# Patient Record
Sex: Male | Born: 1995 | Race: Black or African American | Hispanic: No | Marital: Single | Smoking: Never smoker
Health system: Southern US, Community
[De-identification: ages and names within clinical notes are randomized; demographics above are authoritative.]

## PROBLEM LIST (undated history)

## (undated) HISTORY — PX: FRACTURE SURGERY: SHX138

---
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
## 2020-02-20 ENCOUNTER — Other Ambulatory Visit: Payer: Self-pay

## 2020-02-20 ENCOUNTER — Encounter (HOSPITAL_COMMUNITY): Payer: Self-pay

## 2020-02-20 ENCOUNTER — Ambulatory Visit (HOSPITAL_COMMUNITY)
Admission: EM | Admit: 2020-02-20 | Discharge: 2020-02-20 | Disposition: A | Discharge status: Home / Self Care | Payer: Self-pay | Attending: Family | Admitting: Family

## 2020-02-20 DIAGNOSIS — F322 Major depressive disorder, single episode, severe without psychotic features: Secondary | ICD-10-CM | POA: Insufficient documentation
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
## 2020-02-20 NOTE — ED Provider Notes (Signed)
Behavioral Health Urgent Care Medical Screening Exam  Patient Name: Eduardo Jefferson MRN: [id number] Date of Evaluation: 02/20/20 Chief Complaint:   Diagnosis:  Final diagnoses:  Current severe episode of major depressive disorder without psychotic features without prior episode (HCC)    History of Present illness: Eduardo Jefferson is a 24 y.o. male.  Patient presents voluntarily to [facility].  Patient reports he came today because he is seeking outpatient therapy as well as a discussion surrounding medication.  Patient reports he is about to be a father and does not want his mental health issues to affect his parenting.  Patient also reports his girlfriend follows up with outpatient psychiatry as well as counseling and he believes this as beneficial.  Patient denies any history of previous outpatient psychiatric treatment, patient denies any mental health diagnoses.  Patient reports he feels that he has a depressed mood "for a while."  Patient denies suicidal ideations currently.  Patient denies self-harm behaviors.  Patient endorses 2 prior suicide attempts, last attempt 4 months ago after an argument with his girlfriend.  Patient contracts verbally for safety with this Clinical research associate.  Denies auditory and visual hallucinations currently.  Patient reports 1 previous instance of feeling that he heard "somebody calling my name."  Patient believes this may have been related to his depression.  Patient resides in [place] with his grandmother.  Patient reports he would prefer not to stay at his grandmother's house because he does not have a room to himself there.  Patient denies access to weapons.  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  Patient denies alcohol use.  Patient endorses daily marijuana use.  Patient denies substance use aside from marijuana.  Patient assessed by nurse practitioner.  Patient alert and oriented, answers  appropriately.  Patient denies homicidal ideations.  There is no evidence of delusional thought content and no indication that the patient is responding to internal stimuli.  Patient denies symptoms of paranoia.  Patient endorses average sleep and appetite.  Patient offered support and encouragement.  Patient gives verbal consent to speak with his girlfriend, Dewaine Conger phone number [phone].  With patient's girlfriend in lobby who denies current for patient safety.  Patient's girlfriend denies knowledge that patient has access to any weapons.  Patient's girlfriend reports plan to assist patient with facilitating outpatient follow-up.   Psychiatric Specialty Exam  Presentation  General Appearance:Appropriate for Environment; Casual  Eye Contact:Good  Speech:Clear and Coherent; Normal Rate  Speech Volume:Normal  Handedness:Right   Mood and Affect  Mood:Depressed  Affect:Appropriate; Congruent   Thought Process  Thought Processes:Coherent; Goal Directed  Descriptions of Associations:Intact  Orientation:Full (Time, Place and Person)  Thought Content:WDL; Logical  Hallucinations:None  Ideas of Reference:None  Suicidal Thoughts:No  Homicidal Thoughts:No   Sensorium  Memory:Immediate Good; Recent Good; Remote Good  Judgment:Fair  Insight:Good   Executive Functions  Concentration:Good  Attention Span:Good  Recall:Good  Fund of Knowledge:Good  Language:Good   Psychomotor Activity  Psychomotor Activity:Normal   Assets  Assets:Communication Skills; Desire for Improvement; Financial Resources/Insurance; Intimacy; Leisure Time; Physical Health; Resilience; Social Support; Talents/Skills   Sleep  Sleep:Good  Number of hours: No data recorded  Physical Exam: Physical Exam Vitals and nursing note reviewed.  Constitutional:      Appearance: He is well-developed.  HENT:     Head: Normocephalic.  Cardiovascular:     Rate and Rhythm: Normal  rate.  Pulmonary:     Effort: Pulmonary  effort is normal.  Neurological:     Mental Status: He is alert and oriented to person, place, and time.  Psychiatric:        Attention and Perception: Attention and perception normal.        Mood and Affect: Affect normal. Mood is depressed.        Speech: Speech normal.        Behavior: Behavior normal. Behavior is cooperative.        Thought Content: Thought content normal.        Cognition and Memory: Cognition and memory normal.        Judgment: Judgment normal.    Review of Systems  Constitutional: Negative.   HENT: Negative.   Eyes: Negative.   Respiratory: Negative.   Cardiovascular: Negative.   Gastrointestinal: Negative.   Genitourinary: Negative.   Musculoskeletal: Negative.   Skin: Negative.   Neurological: Negative.   Endo/Heme/Allergies: Negative.   Psychiatric/Behavioral: Positive for depression and substance abuse.   Blood pressure 127/83, pulse 97, temperature 98.7 F (37.1 C), temperature source Tympanic, resp. rate 18, SpO2 100 %. There is no height or weight on file to calculate BMI.  Musculoskeletal: Strength & Muscle Tone: within normal limits Gait & Station: normal Patient leans: N/A   [facility] MSE Discharge Disposition for Follow up and Recommendations: Patient reviewed with Dr. Nelly Rout. Based on my evaluation the patient does not appear to have an emergency medical condition and can be discharged with resources and follow up care in outpatient services for Medication Management and Individual Therapy   Patrcia Dolly, FNP 02/20/2020, 6:18 PM

## 2020-02-20 NOTE — ED Notes (Signed)
Patient A&O x 4, ambulatory. Patient discharged in no acute distress. Patient denied SI/HI, A/VH upon discharge. Patient verbalized understanding of all discharge instructions explained by staff, to include follow up appointments and safety plan. Patient escorted to lobby via staff for transport to destination. Safety maintained.  

## 2020-02-20 NOTE — Discharge Instructions (Signed)

## 2020-02-20 NOTE — ED Triage Notes (Signed)
Pt presents with SI/HI no plans. Pt states, "The thoughts come and go but not today. I just wanted to see if I could talk with a therapist and possibly be put on some medication"

## 2020-04-23 ENCOUNTER — Ambulatory Visit: Payer: Medicaid Other | Admitting: Physician Assistant

## 2020-04-23 ENCOUNTER — Other Ambulatory Visit: Payer: Self-pay

## 2020-04-23 DIAGNOSIS — Z113 Encounter for screening for infections with a predominantly sexual mode of transmission: Secondary | ICD-10-CM

## 2020-04-23 DIAGNOSIS — A549 Gonococcal infection, unspecified: Secondary | ICD-10-CM

## 2020-04-23 DIAGNOSIS — A5401 Gonococcal cystitis and urethritis, unspecified: Secondary | ICD-10-CM

## 2020-04-23 DIAGNOSIS — Z202 Contact with and (suspected) exposure to infections with a predominantly sexual mode of transmission: Secondary | ICD-10-CM

## 2020-04-23 MED ORDER — DOXYCYCLINE HYCLATE 100 MG PO TABS: 100.0000 mg | ORAL_TABLET | Freq: Two times a day (BID) | ORAL | 0 refills | Status: AC

## 2020-04-23 MED ORDER — CEFTRIAXONE SODIUM 500 MG IJ SOLR
500.0000 mg | Freq: Once | INTRAMUSCULAR | Status: AC
  Administered 2020-04-23: 500 mg via INTRAMUSCULAR

## 2020-04-23 NOTE — Progress Notes (Signed)
Gram stain reviewed, positive GC, pt treated per provider orders. Provider orders completed.

## 2020-04-24 LAB — GRAM STAIN

## 2020-04-25 ENCOUNTER — Encounter: Payer: Self-pay | Admitting: Physician Assistant

## 2020-04-25 NOTE — Progress Notes (Signed)
[facility] Department STI clinic/screening visit  Subjective:  Eduardo Jefferson is a 25 y.o. male being seen today for an STI screening visit. The patient reports they do have symptoms.    Patient has the following medical conditions:   Patient Active Problem List   Diagnosis Date Noted  . Current severe episode of major depressive disorder without psychotic features without prior episode [facility])      Chief Complaint  Patient presents with  . SEXUALLY TRANSMITTED DISEASE    screening    HPI  Patient reports that he is having symptoms and is a contact to GC and Chlamydia.  Reports that he has had white discharge and dysuria for 2 weeks.  Denies other symptoms, chronic conditions and regular medicines.  Reports that he has not had a HIV test previously and last void prior to sample collection for Gram stain was over 2 hr ago.   See flowsheet for further details and programmatic requirements.    The following portions of the patient's history were reviewed and updated as appropriate: allergies, current medications, past medical history, past social history, past surgical history and problem list.  Objective:  There were no vitals filed for this visit.  Physical Exam Constitutional:      General: He is not in acute distress.    Appearance: Normal appearance.  HENT:     Head: Normocephalic and atraumatic.     Comments: No nits,lice, or hair loss. No cervical, supraclavicular or axillary adenopathy.    Mouth/Throat:     Mouth: Mucous membranes are moist.     Pharynx: Oropharynx is clear. No oropharyngeal exudate or posterior oropharyngeal erythema.  Eyes:     Conjunctiva/sclera: Conjunctivae normal.  Pulmonary:     Effort: Pulmonary effort is normal.  Abdominal:     Palpations: Abdomen is soft. There is no mass.     Tenderness: There is no abdominal tenderness. There is no guarding or rebound.  Genitourinary:    Penis: Normal.      Testes: Normal.      Comments: Pubic area without nits, lice, hair loss, edema, erythema, lesions and inguinal adenopathy. Penis circumcised without rash or lesions. Small amount of cloudy discharge at meatus. Musculoskeletal:     Cervical back: Neck supple. No tenderness.  Skin:    General: Skin is warm and dry.     Findings: No bruising, erythema, lesion or rash.  Neurological:     Mental Status: He is alert and oriented to person, place, and time.  Psychiatric:        Mood and Affect: Mood normal.        Behavior: Behavior normal.        Thought Content: Thought content normal.        Judgment: Judgment normal.       Assessment and Plan:  Eduardo Jefferson is a 25 y.o. male presenting to the [facility] Department for STI screening   1. Screening for STD (sexually transmitted disease) Patient into clinic with symptoms. Rec condoms with all sex. Await test results.  Counseled that RN will call if needs to RTC for treatment once results are back. - Gram stain - HIV [facility] - Syphilis Serology, [facility]   2. Gonococcal urethritis in male Will treat for GC and cover for Chlamydia with Ceftriaxone 500 mg IM and Doxycycline 100 mg #14 1 po BID for 7 days. No sex for 14 days and until after partner completes  treatment. Call with questions or concerns. - cefTRIAXone (ROCEPHIN) injection 500 mg - doxycycline (VIBRA-TABS) 100 MG tablet; Take 1 tablet (100 mg total) by mouth 2 (two) times daily for 7 days.  Dispense: 14 tablet; Refill: 0     Return in about 3 months (around 07/21/2020) for TOC.  No future appointments.  Matt Holmes, PA

## 2022-07-04 ENCOUNTER — Ambulatory Visit: Payer: Self-pay | Admitting: *Deleted

## 2022-07-04 NOTE — Telephone Encounter (Signed)
  Chief Complaint: Depression and anxiety Symptoms: Depressed and anxious, "Can't see my kids." States drinks alcohol "But not as much as I used to" Smokes pot. States he has had thoughts of hurting himself and others. States "I just need help."  Frequency: "As long as I can remember."  Pertinent Negatives: Patient denies  Disposition: ED /[] Urgent Care (no appt availability in office) / Appointment(In office/virtual)/  [facility]/ Home Care/ Refused Recommended Disposition /[] [facility]/  Follow-up with PCP Additional Notes: Advised ED. Also provided number to [facility] and 988 hotline. No PCP. States he will go to ED. Reason for Disposition  [1] SEVERE anxiety (e.g., extremely anxious with intense emotional symptoms such as feeling of unreality, urge to flee, unable to calm down; unable to cope or function) AND [2] not better after 10 minutes of reassurance and Care Advice  Answer Assessment - Initial Assessment Questions 1. CONCERN: "Did anything happen that prompted you to call today?"      "Don't get to see my kids."  2. ANXIETY SYMPTOMS: "Can you describe how you (your loved one; patient) have been feeling?" (e.g., tense, restless, panicky, anxious, keyed up, overwhelmed, sense of impending doom).      Overwhelmed 3. ONSET: "How long have you been feeling this way?" (e.g., hours, days, weeks)     "As long as I can remember." 4. SEVERITY: "How would you rate the level of anxiety?" (e.g., 0 - 10; or mild, moderate, severe).      5. FUNCTIONAL IMPAIRMENT: "How have these feelings affected your ability to do daily activities?" "Have you had more difficulty than usual doing your normal daily activities?" (e.g., getting better, same, worse; self-care, school, work, interactions)     Yes, all 6. HISTORY: "Have you felt this way before?" "Have you ever been diagnosed with an anxiety problem in the past?" (e.g., generalized anxiety disorder, panic attacks, PTSD).  If Yes, ask: "How was this problem treated?" (e.g., medicines, counseling, etc.)      7. RISK OF HARM - SUICIDAL IDEATION: "Do you ever have thoughts of hurting or killing yourself?" If Yes, ask:  "Do you have these feelings now?" "Do you have a plan on how you would do this?"     Yes, me and others. 8. TREATMENT:  "What has been done so far to treat this anxiety?" (e.g., medicines, relaxation strategies). "What has helped?"      9. TREATMENT - THERAPIST: "Do you have a counselor or therapist? Name?"     No 10. POTENTIAL TRIGGERS: "Do you drink caffeinated beverages (e.g., coffee, colas, teas), and how much daily?" "Do you drink alcohol or use any drugs?" "Have you started any new medicines recently?"       Drinking a lot  not so much now. Smokes pot 11. PATIENT SUPPORT: "Who is with you now?" "Who do you live with?" "Do you have family or friends who you can talk to?"        Nope 12. OTHER SYMPTOMS: "Do you have any other symptoms?" (e.g., feeling depressed, trouble concentrating, trouble sleeping, trouble breathing, palpitations or fast heartbeat, chest pain, sweating, nausea, or diarrhea)       Depression,all of above, work suffering  Protocols used: Anxiety and Panic Attack-A-AH

## 2023-02-20 ENCOUNTER — Emergency Department
Admission: EM | Admit: 2023-02-20 | Discharge: 2023-02-20 | Disposition: A | Discharge status: Home / Self Care | Payer: Medicaid Other | Attending: Emergency Medicine | Admitting: Emergency Medicine

## 2023-02-20 ENCOUNTER — Other Ambulatory Visit: Payer: Self-pay

## 2023-02-20 DIAGNOSIS — A749 Chlamydial infection, unspecified: Secondary | ICD-10-CM | POA: Diagnosis not present

## 2023-02-20 DIAGNOSIS — R300 Dysuria: Secondary | ICD-10-CM | POA: Diagnosis present

## 2023-02-20 LAB — URINALYSIS, COMPLETE (UACMP) WITH MICROSCOPIC
Bilirubin Urine: NEGATIVE
Glucose, UA: NEGATIVE mg/dL
Hgb urine dipstick: NEGATIVE
Ketones, ur: NEGATIVE mg/dL
Leukocytes,Ua: NEGATIVE
Nitrite: NEGATIVE
Protein, ur: NEGATIVE mg/dL
Specific Gravity, Urine: 1.014 (ref 1.005–1.030)
pH: 5 (ref 5.0–8.0)

## 2023-02-20 LAB — CHLAMYDIA/NGC RT PCR (ARMC ONLY)
Chlamydia Tr: DETECTED — AB
N gonorrhoeae: NOT DETECTED

## 2023-02-20 MED ORDER — CEFTRIAXONE SODIUM 1 G IJ SOLR
500.0000 mg | Freq: Once | INTRAMUSCULAR | Status: AC
  Administered 2023-02-20: 500 mg via INTRAMUSCULAR
  Filled 2023-02-20: qty 10

## 2023-02-20 MED ORDER — LIDOCAINE HCL (PF) 1 % IJ SOLN
5.0000 mL | Freq: Once | INTRAMUSCULAR | Status: AC
  Administered 2023-02-20: 5 mL
  Filled 2023-02-20: qty 5

## 2023-02-20 MED ORDER — AZITHROMYCIN 500 MG PO TABS
1000.0000 mg | ORAL_TABLET | Freq: Once | ORAL | Status: AC
  Administered 2023-02-20: 1000 mg via ORAL
  Filled 2023-02-20: qty 2

## 2023-02-20 NOTE — ED Provider Triage Note (Signed)
Emergency Medicine Provider Triage Evaluation Note  Eduardo Jefferson , a 27 y.o. male  was evaluated in triage.  Pt complains of penile itching and dysuria. No known STI exposure.   Review of Systems  Positive: fevers Negative: Penile discharge, rash  Physical Exam  There were no vitals taken for this visit. Gen:   Awake, no distress   Resp:  Normal effort  MSK:   Moves extremities without difficulty  Other:    Medical Decision Making  Medically screening exam initiated at 11:25 AM.  Appropriate orders placed.  Lonald A Hirschi was informed that the remainder of the evaluation will be completed by another provider, this initial triage assessment does not replace that evaluation, and the importance of remaining in the ED until their evaluation is complete.     Cameron Ali, PA-C 02/20/23 1127

## 2023-02-20 NOTE — ED Provider Notes (Signed)
[facility] Provider Note    Event Date/Time   First MD Initiated Contact with Patient 02/20/23 1215     (approximate)   History      HPI  Eduardo Jefferson is a 27 y.o. male   presents to the ED with complaint of dysuria for approximately 3 days.  Patient denies history of penile discharge but reports he did have unprotected sex with his "ex" prior to his symptoms.  Patient denies medical history or location allergies.      Physical Exam   Triage Vital Signs: ED Triage Vitals  Encounter Vitals Group     BP 02/20/23 1129 124/88     Systolic BP Percentile --      Diastolic BP Percentile --      Pulse Rate 02/20/23 1129 83     Resp 02/20/23 1129 18     Temp 02/20/23 1129 98.3 F (36.8 C)     Temp src --      SpO2 02/20/23 1129 100 %     Weight 02/20/23 1126 135 lb (61.2 kg)     Height 02/20/23 1126 5\' 7"  (1.702 m)     Head Circumference --      Peak Flow --      Pain Score 02/20/23 1126 0     Pain Loc --      Pain Education --      Exclude from Growth Chart --     Most recent vital signs: Vitals:   02/20/23 1129  BP: 124/88  Pulse: 83  Resp: 18  Temp: 98.3 F (36.8 C)  SpO2: 100%     General: Awake, no distress.  CV:  Good peripheral perfusion.  Resp:  Normal effort.  Abd:  No distention.  Other:     ED Results / Procedures / Treatments   Labs (all labs ordered are listed, but only abnormal results are displayed) Labs Reviewed  CHLAMYDIA/NGC RT PCR ([facility] ONLY)           - Abnormal; Notable for the following components:      Result Value   Chlamydia Tr DETECTED (*)    All other components within normal limits  URINALYSIS, COMPLETE (UACMP) WITH MICROSCOPIC - Abnormal; Notable for the following components:   Color, Urine YELLOW (*)    APPearance CLEAR (*)    Bacteria, UA RARE (*)    All other components within normal limits  RPR  HIV ANTIBODY (ROUTINE TESTING W REFLEX)      PROCEDURES:  Critical Care performed:    Procedures   MEDICATIONS ORDERED IN ED: Medications  cefTRIAXone (ROCEPHIN) injection 500 mg (500 mg Intramuscular Given 02/20/23 1247)  lidocaine (PF) (XYLOCAINE) 1 % injection 5 mL (5 mLs Infiltration Given 02/20/23 1247)  azithromycin (ZITHROMAX) tablet 1,000 mg (1,000 mg Oral Given 02/20/23 1246)     IMPRESSION / MDM / ASSESSMENT AND PLAN / ED COURSE  I reviewed the triage vital signs and the nursing notes.   Differential diagnosis includes, but is not limited to, dysuria, urinary tract infection, chlamydia, gonorrhea, urethritis.  27 year old male presents to the ED with complaint of dysuria after having unprotected sex with his ex-girlfriend.  Patient is aware that his test results will take several hours to result but because he is symptomatic we will go ahead and treat.  He is instructed to follow-up with the [facility] department if any continued problems.  He is also instructed not to have intercourse with anyone  until they have been checked for STDs and also given instructions on safe sex.  ----------------------------------------- 2:43 PM on 02/20/2023 ----------------------------------------- I called patient who states that he is already seen the results of his test on MyChart.  I reassured him that the medication that he received in the emergency department is the correct treatment for this.  He was also again reminded that he should have any sexual partners tested and treated as well.    Patient's presentation is most consistent with acute complicated illness / injury requiring diagnostic workup.  FINAL CLINICAL IMPRESSION(S) / ED DIAGNOSES   Final diagnoses:  Dysuria  Chlamydia     Rx / DC Orders   ED Discharge Orders     None        Note:  This document was prepared using Dragon voice recognition software and may include unintentional dictation errors.   Tommi Rumps, PA-C 02/20/23 1444    Jene Every, MD 02/20/23 [phone]

## 2023-02-20 NOTE — ED Triage Notes (Signed)
Pt comes with c/o itching and burning with urination. Pt denies any known exposure top std

## 2023-02-20 NOTE — Discharge Instructions (Signed)
Follow-up with the [facility] department and also have any sexual partner follow-up with them also to be tested and treated.  You can see the results of your test on MyChart and if they are positive you will also need to call make an appointment with the health department.  Today you are being treated for gonorrhea and chlamydia.  The medication was given here in the emergency department and no need for prescription to be sent.  Additional information about the [facility] department is attached to your discharge papers.

## 2023-02-21 LAB — RPR: RPR Ser Ql: NONREACTIVE

## 2023-02-21 LAB — HIV ANTIBODY (ROUTINE TESTING W REFLEX): HIV Screen 4th Generation wRfx: NONREACTIVE

## 2023-02-24 ENCOUNTER — Encounter: Payer: Medicaid Other | Admitting: Family Medicine

## 2023-02-24 NOTE — Progress Notes (Signed)
Erroneous encounter-disregard

## 2023-02-25 ENCOUNTER — Telehealth: Payer: Medicaid Other | Admitting: Physician Assistant

## 2023-02-25 DIAGNOSIS — F439 Reaction to severe stress, unspecified: Secondary | ICD-10-CM

## 2023-02-25 DIAGNOSIS — F322 Major depressive disorder, single episode, severe without psychotic features: Secondary | ICD-10-CM

## 2023-02-25 NOTE — Progress Notes (Signed)
Virtual Visit Consent   Willard A Brunette, you are scheduled for a virtual visit with a [facility] provider today. Just as with appointments in the office, your consent must be obtained to participate. Your consent will be active for this visit and any virtual visit you may have with one of our providers in the next 365 days. If you have a MyChart account, a copy of this consent can be sent to you electronically.  As this is a virtual visit, video technology does not allow for your provider to perform a traditional examination. This may limit your provider's ability to fully assess your condition. If your provider identifies any concerns that need to be evaluated in person or the need to arrange testing (such as labs, EKG, etc.), we will make arrangements to do so. Although advances in technology are sophisticated, we cannot ensure that it will always work on either your end or our end. If the connection with a video visit is poor, the visit may have to be switched to a telephone visit. With either a video or telephone visit, we are not always able to ensure that we have a secure connection.  By engaging in this virtual visit, you consent to the provision of healthcare and authorize for your insurance to be billed (if applicable) for the services provided during this visit. Depending on your insurance coverage, you may receive a charge related to this service.  I need to obtain your verbal consent now. Are you willing to proceed with your visit today? Jaymes A Fuhriman has provided verbal consent on 02/25/2023 for a virtual visit (video or telephone). Laure Kidney, [place]  Date: 02/25/2023 8:43 AM  Virtual Visit via Video Note   I, Laure Kidney, connected with  Stephano Cay Nay  ([id number], 10/04/1995) on 02/25/23 at  8:30 AM EST by a video-enabled telemedicine application and verified that I am speaking with the correct person using two identifiers.  Location: Patient: Virtual Visit Location  Patient: Home Provider: Virtual Visit Location Provider: Home Office   I discussed the limitations of evaluation and management by telemedicine and the availability of in person appointments. The patient expressed understanding and agreed to proceed.    History of Present Illness: Kean A Hutcherson is a 27 y.o. who identifies as a male who was assigned male at birth, and is being seen today for ongoing stress due to loss of job. Denies SI/HI or any other complaints.Marland Kitchen  HPI: Anxiety Presents for initial visit. Symptoms include decreased concentration. Symptoms occur most days. The symptoms are aggravated by work stress. The quality of sleep is good.      Problems:  Patient Active Problem List   Diagnosis Date Noted   Current severe episode of major depressive disorder without psychotic features without prior episode (HCC)     Allergies: No Known Allergies Medications: No current outpatient medications on file.  Observations/Objective: Patient is well-developed, well-nourished in no acute distress.  Resting comfortably  at home.  Head is normocephalic, atraumatic.  No labored breathing.  Speech is clear and coherent with logical content.  Patient is alert and oriented at baseline.    Assessment and Plan: 1. Stress (Primary)  Referred patient to 24/[facility] behavioral [facility] clinic.   Follow Up Instructions: I discussed the assessment and treatment plan with the patient. The patient was provided an opportunity to ask questions and all were answered. The patient agreed with the plan and demonstrated an understanding of the instructions.  A copy of  instructions were sent to the patient via MyChart unless otherwise noted below.    The patient was advised to call back or seek an in-person evaluation if the symptoms worsen or if the condition fails to improve as anticipated.    Laure Kidney, PA-C

## 2023-02-25 NOTE — Patient Instructions (Addendum)
  Eduardo Jefferson, thank you for joining Eduardo Kidney, PA-C for today's virtual visit.  While this provider is not your primary care provider (PCP), if your PCP is located in our provider database this encounter information will be shared with them immediately following your visit.   A Eduardo Jefferson account gives you access to today's visit and all your visits, tests, and labs performed at [facility] " click here if you don't have a Eduardo Jefferson account or go to [email or web address]  Consent: (Patient) Eduardo Jefferson provided verbal consent for this virtual visit at the beginning of the encounter.  Current Medications: No current outpatient medications on file.   Medications ordered in this encounter:  No orders of the defined types were placed in this encounter.    *If you need refills on other medications prior to your next appointment, please contact your pharmacy*  Follow-Up: Call back or seek an in-person evaluation if the symptoms worsen or if the condition fails to improve as anticipated.  [facility] [phone]  Other Instructions Report to [facility]   Phone:  [phone]  Address:  [address]  [place], [place] [address]  Hours:  Open 24/7, No appointment required.  If you have been instructed to have an in-person evaluation today at a local Urgent Care facility, please use the link below. It will take you to a list of all of our available [facility], including address, phone number and hours of operation. Please do not delay care.  [facility]  If you or a family member do not have a primary care provider, use the link below to schedule a visit and establish care. When you choose a [facility] primary care physician or advanced practice provider, you gain a long-term partner in health. Find a Primary Care Provider  Learn more about [facility]  in-office and virtual care options:  - Get Care Now
